# Patient Record
Sex: Female | Born: 1960 | Race: Black or African American | Hispanic: No | Marital: Single
Health system: Southern US, Community
[De-identification: ages and names within clinical notes are randomized; demographics above are authoritative.]

## PROBLEM LIST (undated history)

## (undated) DIAGNOSIS — I10 Essential (primary) hypertension: Secondary | ICD-10-CM

## (undated) DIAGNOSIS — J45909 Unspecified asthma, uncomplicated: Secondary | ICD-10-CM

## (undated) HISTORY — PX: KNEE SURGERY: SHX244

---

## 2014-05-13 ENCOUNTER — Emergency Department (HOSPITAL_COMMUNITY): Payer: Managed Care, Other (non HMO)

## 2014-05-13 ENCOUNTER — Encounter (HOSPITAL_COMMUNITY): Payer: Self-pay

## 2014-05-13 ENCOUNTER — Emergency Department (HOSPITAL_COMMUNITY)
Admission: EM | Admit: 2014-05-13 | Discharge: 2014-05-13 | Disposition: A | Discharge status: Home / Self Care | Payer: Managed Care, Other (non HMO) | Attending: Emergency Medicine | Admitting: Emergency Medicine

## 2014-05-13 DIAGNOSIS — R5383 Other fatigue: Secondary | ICD-10-CM | POA: Insufficient documentation

## 2014-05-13 DIAGNOSIS — R0981 Nasal congestion: Secondary | ICD-10-CM | POA: Diagnosis not present

## 2014-05-13 DIAGNOSIS — J45909 Unspecified asthma, uncomplicated: Secondary | ICD-10-CM | POA: Diagnosis not present

## 2014-05-13 DIAGNOSIS — R05 Cough: Secondary | ICD-10-CM | POA: Insufficient documentation

## 2014-05-13 DIAGNOSIS — Z79899 Other long term (current) drug therapy: Secondary | ICD-10-CM | POA: Insufficient documentation

## 2014-05-13 DIAGNOSIS — R51 Headache: Secondary | ICD-10-CM | POA: Insufficient documentation

## 2014-05-13 DIAGNOSIS — R509 Fever, unspecified: Secondary | ICD-10-CM | POA: Insufficient documentation

## 2014-05-13 DIAGNOSIS — R69 Illness, unspecified: Secondary | ICD-10-CM

## 2014-05-13 DIAGNOSIS — R110 Nausea: Secondary | ICD-10-CM | POA: Insufficient documentation

## 2014-05-13 DIAGNOSIS — J3489 Other specified disorders of nose and nasal sinuses: Secondary | ICD-10-CM | POA: Insufficient documentation

## 2014-05-13 DIAGNOSIS — M791 Myalgia: Secondary | ICD-10-CM | POA: Insufficient documentation

## 2014-05-13 DIAGNOSIS — R630 Anorexia: Secondary | ICD-10-CM | POA: Diagnosis not present

## 2014-05-13 DIAGNOSIS — J111 Influenza due to unidentified influenza virus with other respiratory manifestations: Secondary | ICD-10-CM

## 2014-05-13 HISTORY — DX: Unspecified asthma, uncomplicated: J45.909

## 2014-05-13 MED ORDER — KETOROLAC TROMETHAMINE 30 MG/ML IJ SOLN
30.0000 mg | Freq: Once | INTRAMUSCULAR | Status: AC
  Administered 2014-05-13: 30 mg via INTRAVENOUS
  Filled 2014-05-13: qty 1

## 2014-05-13 MED ORDER — BENZONATATE 100 MG PO CAPS: 100.0000 mg | ORAL_CAPSULE | Freq: Three times a day (TID) | ORAL | Status: DC

## 2014-05-13 MED ORDER — SODIUM CHLORIDE 0.9 % IV BOLUS (SEPSIS)
1000.0000 mL | Freq: Once | INTRAVENOUS | Status: AC
  Administered 2014-05-13: 1000 mL via INTRAVENOUS

## 2014-05-13 MED ORDER — ONDANSETRON 4 MG PO TBDP: 4.0000 mg | ORAL_TABLET | Freq: Three times a day (TID) | ORAL | Status: DC | PRN

## 2014-05-13 MED ORDER — ONDANSETRON HCL 4 MG/2ML IJ SOLN
4.0000 mg | Freq: Once | INTRAMUSCULAR | Status: AC
  Administered 2014-05-13: 4 mg via INTRAVENOUS
  Filled 2014-05-13: qty 2

## 2014-05-13 MED ORDER — METOCLOPRAMIDE HCL 5 MG/ML IJ SOLN
10.0000 mg | Freq: Once | INTRAMUSCULAR | Status: AC
  Administered 2014-05-13: 10 mg via INTRAVENOUS
  Filled 2014-05-13: qty 2

## 2014-05-13 MED ORDER — NAPROXEN 500 MG PO TABS: 500.0000 mg | ORAL_TABLET | Freq: Two times a day (BID) | ORAL | Status: DC

## 2014-05-13 MED ORDER — ACETAMINOPHEN 325 MG PO TABS
650.0000 mg | ORAL_TABLET | Freq: Once | ORAL | Status: AC
  Administered 2014-05-13: 650 mg via ORAL
  Filled 2014-05-13: qty 2

## 2014-05-13 NOTE — ED Notes (Addendum)
Pt presents with c/o headache, nausea, and cough that started 3 days ago. Pt reports her body is aching as well. Pt also reports that she has had a fever at home, febrile in triage as well.

## 2014-05-13 NOTE — ED Provider Notes (Signed)
CSN: [id number]     Arrival date & time 05/13/14  1641 History   First MD Initiated Contact with Patient 05/13/14 1658     Chief Complaint  Patient presents with  . Fever  . Headache  . Nausea     (Consider location/radiation/quality/duration/timing/severity/associated sxs/prior Treatment) HPI Comments: Patient with three-day history of fever, body aches, nonproductive cough, headache, nausea without vomiting, nasal congestion. No significant shortness of breath or chest pain. No sore throat. Patient had diarrhea at onset however this is improved. No urinary symptoms, no skin rash. Patient did not receive a flu shot this year. She's been trying over-the-counter medications without relief. No difficulty walking, vision change, weakness in arms or legs. The onset of this condition was acute. The course is constant. Aggravating factors: none. Alleviating factors: none.    Patient is a 54 y.o. female presenting with fever and headaches. The history is provided by the patient.  Fever Associated symptoms: chills, congestion, cough, headaches, myalgias, nausea and rhinorrhea   Associated symptoms: no chest pain, no diarrhea, no dysuria, no ear pain, no rash, no sore throat and no vomiting   Headache Associated symptoms: congestion, cough, fatigue, fever, myalgias and nausea   Associated symptoms: no abdominal pain, no diarrhea, no dizziness, no ear pain, no numbness, no sore throat, no vomiting and no weakness     Past Medical History  Diagnosis Date  . Asthma    Past Surgical History  Procedure Laterality Date  . Knee surgery     No family history on file. History  Substance Use Topics  . Smoking status: Never Smoker   . Smokeless tobacco: Not on file  . Alcohol Use: No   OB History    No data available     Review of Systems  Constitutional: Positive for fever, chills, appetite change and fatigue.  HENT: Positive for congestion and rhinorrhea. Negative for ear pain and sore  throat.   Eyes: Negative for redness.  Respiratory: Positive for cough. Negative for shortness of breath and wheezing.   Cardiovascular: Negative for chest pain.  Gastrointestinal: Positive for nausea. Negative for vomiting, abdominal pain, diarrhea and blood in stool.  Genitourinary: Negative for dysuria.  Musculoskeletal: Positive for myalgias. Negative for gait problem.  Skin: Negative for rash.  Neurological: Positive for headaches. Negative for dizziness, facial asymmetry, speech difficulty, weakness and numbness.    Allergies  Other  Home Medications   Prior to Admission medications   Medication Sig Start Date End Date Taking? Authorizing Provider  ferrous sulfate 325 (65 FE) MG tablet Take 325 mg by mouth daily.   Yes Historical Provider, MD  FIBER PO Take 4 capsules by mouth 2 (two) times daily.   Yes Historical Provider, MD  guaiFENesin (MUCINEX) 600 MG 12 hr tablet Take 600 mg by mouth 2 (two) times daily as needed for cough or to loosen phlegm.   Yes Historical Provider, MD  OVER THE COUNTER MEDICATION Take 1 tablet by mouth once. OTC allergy medication food lion brand.   Yes Historical Provider, MD   BP 129/96 mmHg  Pulse 81  Temp(Src) 101.7 F (38.7 C) (Oral)  Resp 18  SpO2 96%   Physical Exam  Constitutional: She appears well-developed and well-nourished.  Pt tearful.  HENT:  Head: Normocephalic and atraumatic.  Right Ear: Tympanic membrane, external ear and ear canal normal.  Left Ear: Tympanic membrane, external ear and ear canal normal.  Nose: Mucosal edema and rhinorrhea present.  Mouth/Throat: Uvula is midline, oropharynx  is clear and moist and mucous membranes are normal. Mucous membranes are not dry. No oral lesions. No trismus in the jaw. No uvula swelling. No oropharyngeal exudate, posterior oropharyngeal edema, posterior oropharyngeal erythema or tonsillar abscesses.  Eyes: Conjunctivae are normal. Right eye exhibits no discharge. Left eye exhibits no  discharge.  Neck: Normal range of motion. Neck supple.  Full ROM neck, no meningismus  Cardiovascular: Normal rate, regular rhythm and normal heart sounds.   No murmur heard. Pulmonary/Chest: Effort normal and breath sounds normal. No respiratory distress. She has no wheezes. She has no rales.  Abdominal: Soft. There is no tenderness. There is no rebound and no guarding.  Lymphadenopathy:    She has no cervical adenopathy.  Neurological: She is alert.  Skin: Skin is warm and dry.  Psychiatric: She has a normal mood and affect.  Nursing note and vitals reviewed.   ED Course  Procedures (including critical care time) Labs Review Labs Reviewed - No data to display  Imaging Review Dg Chest 2 View  05/13/2014   CLINICAL DATA:  Shortness of breath, cough, congestion, weakness  EXAM: CHEST  2 VIEW  COMPARISON:  None.  FINDINGS: Cardiomediastinal silhouette is unremarkable. No acute infiltrate or pleural effusion. No pulmonary edema. Bony thorax is unremarkable.  IMPRESSION: No active cardiopulmonary disease.   Electronically Signed   By: Natasha MeadLiviu  Pop M.D.   On: 05/13/2014 19:16     EKG Interpretation None       5:56 PM Patient seen and examined. Work-up initiated. Medications ordered. Suspect influenza but will rule out PNA.   Vital signs reviewed and are as follows: BP 129/96 mmHg  Pulse 81  Temp(Src) 101.7 F (38.7 C) (Oral)  Resp 18  SpO2 96%  10:08 PM Patient feeling better after sleeping, reglan, 2L IVF. Will d/c to home with zofran, naproxen, tessalon.   Patient discharged to home. Encouraged to rest and drink plenty of fluids.  Patient told to return to ED or see their primary doctor if their symptoms worsen, high fever not controlled with tylenol, persistent vomiting, they feel they are dehydrated, or if they have any other concerns.  Patient verbalized understanding and agreed with plan.     MDM   Final diagnoses:  Influenza-like illness   Patient with symptoms  consistent with influenza. Vitals are stable, low-grade fever. No signs of dehydration, tolerating PO's. Lungs are clear. CXR neg. Supportive therapy indicated with return if symptoms worsen. Patient counseled.     Renne CriglerJoshua Varvara Legault, PA-C 05/13/14 2209  Raeford RazorStephen Kohut, MD 05/13/14 2312

## 2014-05-13 NOTE — Discharge Instructions (Signed)
Please read and follow all provided instructions.  Your diagnoses today include:  1. Influenza-like illness     Tests performed today include:  Chest x-ray - no pneumonia  Vital signs. See below for your results today.   Medications prescribed:   Zofran (ondansetron) - for nausea and vomiting   Naproxen - anti-inflammatory pain medication  Do not exceed 500mg  naproxen every 12 hours, take with food  You have been prescribed an anti-inflammatory medication or NSAID. Take with food. Take smallest effective dose for the shortest duration needed for your pain. Stop taking if you experience stomach pain or vomiting.    Tessalon Perles - cough suppressant medication  Take any prescribed medications only as directed.  Home care instructions:  Follow any educational materials contained in this packet. Please continue drinking plenty of fluids. Use over-the-counter cold and flu medications as needed as directed on packaging for symptom relief. You may also use ibuprofen or tylenol as directed on packaging for pain or fever.   BE VERY CAREFUL not to take multiple medicines containing Tylenol (also called acetaminophen). Doing so can lead to an overdose which can damage your liver and cause liver failure and possibly death.   Follow-up instructions: Please follow-up with your primary care provider in the next 3 days for further evaluation of your symptoms.   Return instructions:   Please return to the Emergency Department if you experience worsening symptoms.  Please return if you have a high fever greater than 101 degrees not controlled with over-the-counter medications, persistent vomiting and cannot keep down fluids, or worsening trouble breathing.  Please return if you have any other emergent concerns.  Additional Information:  Your vital signs today were: BP 116/62 mmHg   Pulse 95   Temp(Src) 99.8 F (37.7 C) (Oral)   Resp 20   SpO2 98% If your blood pressure (BP) was  elevated above 135/85 this visit, please have this repeated by your doctor within one month.

## 2017-02-08 ENCOUNTER — Emergency Department (HOSPITAL_COMMUNITY)
Admission: EM | Admit: 2017-02-08 | Discharge: 2017-02-08 | Disposition: A | Discharge status: Home / Self Care | Payer: Managed Care, Other (non HMO) | Attending: Emergency Medicine | Admitting: Emergency Medicine

## 2017-02-08 ENCOUNTER — Other Ambulatory Visit: Payer: Self-pay

## 2017-02-08 ENCOUNTER — Emergency Department (HOSPITAL_COMMUNITY): Payer: Managed Care, Other (non HMO)

## 2017-02-08 ENCOUNTER — Encounter (HOSPITAL_COMMUNITY): Payer: Self-pay | Admitting: Emergency Medicine

## 2017-02-08 DIAGNOSIS — I10 Essential (primary) hypertension: Secondary | ICD-10-CM | POA: Insufficient documentation

## 2017-02-08 DIAGNOSIS — J40 Bronchitis, not specified as acute or chronic: Secondary | ICD-10-CM | POA: Insufficient documentation

## 2017-02-08 DIAGNOSIS — J4521 Mild intermittent asthma with (acute) exacerbation: Secondary | ICD-10-CM

## 2017-02-08 DIAGNOSIS — Z79899 Other long term (current) drug therapy: Secondary | ICD-10-CM | POA: Diagnosis not present

## 2017-02-08 DIAGNOSIS — R0602 Shortness of breath: Secondary | ICD-10-CM | POA: Diagnosis present

## 2017-02-08 HISTORY — DX: Essential (primary) hypertension: I10

## 2017-02-08 LAB — RAPID STREP SCREEN (MED CTR MEBANE ONLY): Streptococcus, Group A Screen (Direct): NEGATIVE

## 2017-02-08 MED ORDER — BENZONATATE 100 MG PO CAPS: 100.0000 mg | ORAL_CAPSULE | Freq: Three times a day (TID) | ORAL | 0 refills | Status: DC

## 2017-02-08 MED ORDER — PREDNISONE 10 MG PO TABS: ORAL_TABLET | ORAL | 0 refills | Status: AC

## 2017-02-08 MED ORDER — PREDNISONE 20 MG PO TABS
60.0000 mg | ORAL_TABLET | Freq: Once | ORAL | Status: AC
  Administered 2017-02-08: 60 mg via ORAL
  Filled 2017-02-08: qty 3

## 2017-02-08 MED ORDER — ALBUTEROL SULFATE HFA 108 (90 BASE) MCG/ACT IN AERS
1.0000 | INHALATION_SPRAY | Freq: Once | RESPIRATORY_TRACT | Status: AC
  Administered 2017-02-08: 1 via RESPIRATORY_TRACT
  Filled 2017-02-08: qty 6.7

## 2017-02-08 MED ORDER — ALBUTEROL SULFATE (2.5 MG/3ML) 0.083% IN NEBU
5.0000 mg | INHALATION_SOLUTION | Freq: Once | RESPIRATORY_TRACT | Status: AC
  Administered 2017-02-08: 5 mg via RESPIRATORY_TRACT
  Filled 2017-02-08: qty 6

## 2017-02-08 MED ORDER — ALBUTEROL SULFATE HFA 108 (90 BASE) MCG/ACT IN AERS: 1.0000 | INHALATION_SPRAY | Freq: Four times a day (QID) | RESPIRATORY_TRACT | 0 refills | Status: AC | PRN

## 2017-02-08 MED ORDER — IPRATROPIUM BROMIDE 0.02 % IN SOLN
0.5000 mg | Freq: Once | RESPIRATORY_TRACT | Status: AC
  Administered 2017-02-08: 0.5 mg via RESPIRATORY_TRACT
  Filled 2017-02-08: qty 2.5

## 2017-02-08 MED ORDER — AZITHROMYCIN 250 MG PO TABS: 250.0000 mg | ORAL_TABLET | Freq: Every day | ORAL | 0 refills | Status: DC

## 2017-02-08 NOTE — ED Provider Notes (Signed)
[facility] Provider Note   CSN: [id number] Arrival date & time: 02/08/17  1056     History   Chief Complaint Chief Complaint  Patient presents with  . Shortness of Breath    HPI Pam Hunter is a 56 y.o. female.  HPI   Patient is a 56 year old female with a history of asthma and hypertension presenting for cough, congestion, and shortness of breath for 3 days.  Patient reports that she is [place] her [place] here [place] from [place] and did not bring any of her inhalers.  During the travel down here, patient began becoming congested and having rhinorrhea as well as cough.  Patient reports that her cough is dry and is keeping her up at night.  No recorded fevers or chills at home.  Patient has been trying over-the-counter cough drops for her symptoms.  No recent hospitalizations, surgeries, history of DVT/PE, no history of cancer, or estrogen use.  Past Medical History:  Diagnosis Date  . Asthma   . Hypertension     There are no active problems to display for this patient.   Past Surgical History:  Procedure Laterality Date  . KNEE SURGERY      OB History    No data available       Home Medications    Prior to Admission medications   Medication Sig Start Date End Date Taking? Authorizing Provider  albuterol (PROVENTIL HFA;VENTOLIN HFA) 108 (90 Base) MCG/ACT inhaler Inhale 1-2 puffs into the lungs every 6 (six) hours as needed for wheezing or shortness of breath. 02/08/17   Aviva KluverMurray, Senora Lacson B, PA-C  azithromycin (ZITHROMAX) 250 MG tablet Take 1 tablet (250 mg total) by mouth daily. Take first 2 tablets together, then 1 every day until finished. 02/08/17   Aviva KluverMurray, Leshon Armistead B, PA-C  benzonatate (TESSALON) 100 MG capsule Take 1 capsule (100 mg total) by mouth every 8 (eight) hours. 02/08/17   Aviva KluverMurray, Betheny Suchecki B, PA-C  ferrous sulfate 325 (65 FE) MG tablet Take 325 mg by mouth daily.    [provider]  FIBER PO Take 4  capsules by mouth 2 (two) times daily.    [provider]  guaiFENesin (MUCINEX) 600 MG 12 hr tablet Take 600 mg by mouth 2 (two) times daily as needed for cough or to loosen phlegm.    [provider]  naproxen (NAPROSYN) 500 MG tablet Take 1 tablet (500 mg total) by mouth 2 (two) times daily. 05/13/14   Renne CriglerGeiple, Joshua, PA-C  ondansetron (ZOFRAN ODT) 4 MG disintegrating tablet Take 1 tablet (4 mg total) by mouth every 8 (eight) hours as needed for nausea or vomiting. 05/13/14   Renne CriglerGeiple, Joshua, PA-C  OVER THE COUNTER MEDICATION Take 1 tablet by mouth once. OTC allergy medication food lion brand.    [provider]  predniSONE (DELTASONE) 10 MG tablet Take 5 tablets (50 mg total) by mouth daily with breakfast for 1 day, THEN 4 tablets (40 mg total) daily with breakfast for 1 day, THEN 3 tablets (30 mg total) daily with breakfast for 1 day, THEN 2 tablets (20 mg total) daily with breakfast for 1 day, THEN 1 tablet (10 mg total) daily with breakfast for 1 day. 02/08/17 02/13/17  Elisha PonderMurray, Rafia Shedden B, PA-C    Family History No family history on file.  Social History Social History   Tobacco Use  . Smoking status: Never Smoker  . Smokeless tobacco: Never Used  Substance Use Topics  . Alcohol  use: No  . Drug use: No     Allergies   Other and Sulfa antibiotics   Review of Systems Review of Systems  Constitutional: Negative for chills and fever.  HENT: Positive for congestion, rhinorrhea and sore throat. Negative for sinus pressure, trouble swallowing and voice change.   Respiratory: Positive for cough and shortness of breath.   Cardiovascular: Negative for chest pain.  Gastrointestinal: Negative for abdominal pain, nausea and vomiting.  Musculoskeletal: Negative for myalgias.  Neurological: Positive for headaches.  All other systems reviewed and are negative.    Physical Exam Updated Vital Signs BP 134/86 (BP Location: Right Arm)   Pulse 99   Temp 100 F  (37.8 C) (Oral)   Resp 16   SpO2 100%   Physical Exam  Constitutional: She appears well-developed and well-nourished. No distress.  HENT:  Head: Normocephalic and atraumatic.  Mouth/Throat: Oropharynx is clear and moist.  Normal phonation. No muffled voice sounds. Patient swallows secretions without difficulty. Dentition normal. No lesions of tongue or buccal mucosa. Uvula midline. No asymmetric swelling of the posterior pharynx. Minimal erythema of posterior pharynx. No tonsillar exuduate. No lingual swelling. No induration inferior to tongue. No submandibular tenderness, swelling, or induration.  Tissues of the neck supple. No cervical lymphadenopathy.  Eyes: Conjunctivae and EOM are normal. Pupils are equal, round, and reactive to light.  Neck: Normal range of motion. Neck supple.  Cardiovascular: Normal rate, regular rhythm, S1 normal and S2 normal.  No murmur heard. No lower extremity edema.  Intact distal pulses.  No calf tenderness.  Pulmonary/Chest: Effort normal and breath sounds normal. She has no wheezes.  Coarse lung sounds in bilateral posterior lower lung fields.  Abdominal: Soft. She exhibits no distension. There is no tenderness. There is no guarding.  Musculoskeletal: Normal range of motion. She exhibits no edema or deformity.  Lymphadenopathy:    She has no cervical adenopathy.  Neurological: She is alert.  Cranial nerves grossly intact. Patient was extremities symmetrically and with good coordination.  Skin: Skin is warm and dry. No rash noted. No erythema.  Psychiatric: She has a normal mood and affect. Her behavior is normal. Judgment and thought content normal.  Nursing note and vitals reviewed.    ED Treatments / Results  Labs (all labs ordered are listed, but only abnormal results are displayed) Labs Reviewed  RAPID STREP SCREEN (NOT AT [facility])  CULTURE, GROUP A STREP [facility])    EKG  EKG Interpretation None       Radiology Dg Chest 2 View  Result  Date: 02/08/2017 CLINICAL DATA:  Cough, wheezing EXAM: CHEST  2 VIEW COMPARISON:  05/13/2014 FINDINGS: The heart size and mediastinal contours are within normal limits. Both lungs are clear. The visualized skeletal structures are unremarkable. IMPRESSION: No active cardiopulmonary disease. Electronically Signed   By: Elige KoHetal  Patel   On: 02/08/2017 12:06    Procedures Procedures (including critical care time)  Medications Ordered in ED Medications  predniSONE (DELTASONE) tablet 60 mg (60 mg Oral Given 02/08/17 1216)  albuterol (PROVENTIL) (2.5 MG/3ML) 0.083% nebulizer solution 5 mg (5 mg Nebulization Given 02/08/17 1216)  ipratropium (ATROVENT) nebulizer solution 0.5 mg (0.5 mg Nebulization Given 02/08/17 1216)  albuterol (PROVENTIL HFA;VENTOLIN HFA) 108 (90 Base) MCG/ACT inhaler 1 puff (1 puff Inhalation Given 02/08/17 1447)     Initial Impression / Assessment and Plan / ED Course  I have reviewed the triage vital signs and the nursing notes.  Pertinent labs & imaging results that were available  during my care of the patient were reviewed by me and considered in my medical decision making (see chart for details).     Final Clinical Impressions(s) / ED Diagnoses   Final diagnoses:  Bronchitis  Mild intermittent asthma with exacerbation   Patient is nontoxic-appearing, afebrile and in no acute distress at this time.  No antipyretics prior to arrival. I suspect that this is a possible asthma exacerbation secondary to upper respiratory infection.  Will obtain chest x-ray and administer breathing treatment as well as prednisone.  On reevaluation, patient feeling improved.  Patient ambulating in the emergency department without shortness of breath and with ease.  Chest x-ray without evidence of pneumonia.  Rapid strep negative.  Patient with likely asthma exacerbation compounded with upper respiratory infection.  Will treat with albuterol, prednisone, Tessalon, and a azithromycin.  Return  precautions given for any worsening shortness of breath, chest pain, or intractable nausea or vomiting.  Patient is in understanding and agrees with the plan of care.  ED Discharge Orders        Ordered    benzonatate (TESSALON) 100 MG capsule  Every 8 hours     02/08/17 1437    albuterol (PROVENTIL HFA;VENTOLIN HFA) 108 (90 Base) MCG/ACT inhaler  Every 6 hours PRN     02/08/17 1437    azithromycin (ZITHROMAX) 250 MG tablet  Daily     02/08/17 1437    predniSONE (DELTASONE) 10 MG tablet     02/08/17 1437       Delia Chimes 02/08/17 1534    Tilden Fossa, MD 02/10/17 [id number]

## 2017-02-08 NOTE — ED Triage Notes (Signed)
States is from  Out of town and left her asthma meds at home and she is wheezing and needs them

## 2017-02-08 NOTE — ED Notes (Signed)
Gave pt apple juice  

## 2017-02-08 NOTE — ED Notes (Signed)
To x-ray

## 2017-02-08 NOTE — ED Notes (Signed)
States is better

## 2017-02-08 NOTE — Discharge Instructions (Signed)
Please read and follow all provided instructions.  Your diagnoses today include:  1. Bronchitis     You appear to have an upper respiratory infection (URI). An upper respiratory tract infection, or cold, is a viral infection of the air passages leading to the lungs. It should improve gradually after 5-7 days. You may have a lingering cough that lasts for 2- 4 weeks after the infection. We often cover with antibiotics in high risk patients.   Tests performed today include: Vital signs. See below for your results today.   Medications prescribed:   Take any prescribed medications only as directed. Treatment for your infection is aimed at treating the symptoms. There are no medications, such as antibiotics, that will cure your infection.   Home care instructions:  Follow any educational materials contained in this packet.   Your illness is contagious and can be spread to others, especially during the first 3 or 4 days. It cannot be cured by antibiotics or other medicines. Take basic precautions such as washing your hands often, covering your mouth when you cough or sneeze, and avoiding public places where you could spread your illness to others.   Please continue drinking plenty of fluids.  Use over-the-counter medicines as needed as directed on packaging for symptom relief.  You may also use ibuprofen or tylenol as directed on packaging for pain or fever.  Do not take multiple medicines containing Tylenol or acetaminophen to avoid taking too much of this medication.  Follow-up instructions: Please follow-up with your primary care provider in the next 3 days for further evaluation of your symptoms if you are not feeling better.   Return instructions:  Please return to the Emergency Department if you experience worsening symptoms.  RETURN IMMEDIATELY IF you develop shortness of breath, confusion or altered mental status, a new rash, become dizzy, faint, or poorly responsive, or are unable to be  cared for at home. Please return if you have persistent vomiting and cannot keep down fluids or develop a fever that is not controlled by tylenol or motrin.   Please return if you have any other emergent concerns.  Additional Information:  Your vital signs today were: BP 122/86 (BP Location: Right Arm)    Pulse (!) 102    Temp 100 F (37.8 C) (Oral)    Resp 19    SpO2 99%  If your blood pressure (BP) was elevated above 135/85 this visit, please have this repeated by your doctor within one month. --------------

## 2017-02-08 NOTE — ED Notes (Signed)
Patient transported to X-ray 

## 2017-02-10 LAB — CULTURE, GROUP A STREP (THRC)

## 2017-06-23 ENCOUNTER — Emergency Department
Admission: EM | Admit: 2017-06-23 | Discharge: 2017-06-23 | Disposition: A | Discharge status: Home / Self Care | Payer: Commercial Managed Care - PPO | Attending: Emergency Medicine | Admitting: Emergency Medicine

## 2017-06-23 DIAGNOSIS — D72819 Decreased white blood cell count, unspecified: Secondary | ICD-10-CM | POA: Insufficient documentation

## 2017-06-23 DIAGNOSIS — M7989 Other specified soft tissue disorders: Secondary | ICD-10-CM | POA: Insufficient documentation

## 2017-06-23 LAB — COMPREHENSIVE METABOLIC PANEL
ALT: 10 U/L (ref 0–55)
AST (SGOT): 25 U/L (ref 5–34)
Albumin/Globulin Ratio: 1 (ref 0.9–2.2)
Albumin: 3.9 g/dL (ref 3.5–5.0)
Alkaline Phosphatase: 54 U/L (ref 37–106)
BUN: 9 mg/dL (ref 7.0–19.0)
Bilirubin, Total: 0.2 mg/dL (ref 0.2–1.2)
CO2: 23 mEq/L (ref 22–29)
Calcium: 9.4 mg/dL (ref 8.5–10.5)
Chloride: 105 mEq/L (ref 100–111)
Creatinine: 0.7 mg/dL (ref 0.6–1.0)
Globulin: 3.8 g/dL — ABNORMAL HIGH (ref 2.0–3.6)
Glucose: 77 mg/dL (ref 70–100)
Potassium: 4 mEq/L (ref 3.5–5.1)
Protein, Total: 7.7 g/dL (ref 6.0–8.3)
Sodium: 138 mEq/L (ref 136–145)

## 2017-06-23 LAB — URINALYSIS, REFLEX TO MICROSCOPIC EXAM IF INDICATED
Bilirubin, UA: NEGATIVE
Blood, UA: NEGATIVE
Glucose, UA: NEGATIVE
Ketones UA: 20 — AB
Leukocyte Esterase, UA: NEGATIVE
Nitrite, UA: NEGATIVE
Protein, UR: 100 — AB
Specific Gravity UA: 1.028 (ref 1.001–1.035)
Urine pH: 6 (ref 5.0–8.0)
Urobilinogen, UA: NORMAL mg/dL

## 2017-06-23 LAB — CBC AND DIFFERENTIAL
Absolute NRBC: 0 10*3/uL (ref 0.00–0.00)
Hematocrit: 41.1 % (ref 34.7–43.7)
Hgb: 13.1 g/dL (ref 11.4–14.8)
MCH: 29.7 pg (ref 25.1–33.5)
MCHC: 31.9 g/dL (ref 31.5–35.8)
MCV: 93.2 fL (ref 78.0–96.0)
MPV: 10.3 fL (ref 8.9–12.5)
Nucleated RBC: 0 /100 WBC (ref 0.0–0.0)
Platelets: 245 10*3/uL (ref 142–346)
RBC: 4.41 10*6/uL (ref 3.90–5.10)
RDW: 13 % (ref 11–15)
WBC: 2.42 10*3/uL — ABNORMAL LOW (ref 3.10–9.50)

## 2017-06-23 LAB — SEDIMENTATION RATE: Sed Rate: 20 mm/Hr (ref 0–20)

## 2017-06-23 LAB — MAN DIFF ONLY
Atypical Lymphocytes %: 2 %
Atypical Lymphocytes Absolute: 0.05 10*3/uL — ABNORMAL HIGH (ref 0.00–0.00)
Band Neutrophils Absolute: 0.02 10*3/uL (ref 0.00–1.00)
Band Neutrophils: 1 %
Basophils Absolute Manual: 0.07 10*3/uL (ref 0.00–0.08)
Basophils Manual: 3 %
Eosinophils Absolute Manual: 0.1 10*3/uL (ref 0.00–0.44)
Eosinophils Manual: 4 %
Lymphocytes Absolute Manual: 1.89 10*3/uL (ref 0.42–3.22)
Lymphocytes Manual: 78 %
Monocytes Absolute: 0.12 10*3/uL — ABNORMAL LOW (ref 0.21–0.85)
Monocytes Manual: 5 %
Neutrophils Absolute Manual: 0.17 10*3/uL — ABNORMAL LOW (ref 1.10–6.33)
Segmented Neutrophils: 7 %

## 2017-06-23 LAB — TSH: TSH: 3.47 u[IU]/mL (ref 0.35–4.94)

## 2017-06-23 LAB — GFR: EGFR: >60

## 2017-06-23 LAB — CELL MORPHOLOGY
Cell Morphology: NORMAL
Platelet Estimate: NORMAL

## 2017-06-23 LAB — MAGNESIUM: Magnesium: 2.7 mg/dL — ABNORMAL HIGH (ref 1.6–2.6)

## 2017-06-23 LAB — C-REACTIVE PROTEIN: C-Reactive Protein: 0.7 mg/dL (ref 0.0–0.8)

## 2017-06-23 LAB — PHOSPHORUS: Phosphorus: 3.8 mg/dL (ref 2.3–4.7)

## 2017-06-23 NOTE — Discharge Instructions (Signed)
Dear [name]:    Thank you for choosing the [facility] Emergency Department, the premier emergency department in the  area.  I hope your visit today was EXCELLENT.    Specific instructions for your visit today:    You were seen in the emergency department for discomfort and swelling in your hands as well as a rash on your arms and legs. Your screening emergency department labs were reassuring but did not show a cause of your symptoms. We discussed your results and recommended that you follow up with rheumatology for your rash and hand swelling and hematology for your low white blood cell count.     Keep all appointments with your doctor. Following up with your doctor or the referral doctor is very important.    YOU SHOULD SEEK MEDICAL ATTENTION IMMEDIATELY, EITHER HERE OR AT THE NEAREST EMERGENCY DEPARTMENT, IF ANY OF THE FOLLOWING OCCUR:     You have vomiting (throwing up) that stops you from keeping any fluids in your stomach.   You have a faster heart rate. You feel lightheaded, or you pass out (lose consciousness).   You are vomiting blood or you have serious chest pain after vomiting.   You have a fever (temperature higher than 100.64F or 38C), chills, or abdominal (belly) pain.    If you can't talk with your doctor, or if you feel you need to be rechecked, come back here or go to the nearest emergency department.      If you do not continue to improve or your condition worsens, please contact your doctor or return immediately to the Emergency Department.    Sincerely,  Lang, Alcario Drought, MD  Attending Emergency Physician  [facility] Emergency Department    ONSITE PHARMACY  Our full service onsite pharmacy is located in the ER waiting room.  Open 7 days a week from 9 am to 9 pm.  We accept all major insurances and prices are competitive with major retailers.  Ask your provider to print your prescriptions down to the pharmacy to speed you on your way home.    OBTAINING A  PRIMARY CARE APPOINTMENT    Primary care physicians (PCPs, also known as primary care doctors) are either internists or family medicine doctors. Both types of PCPs focus on health promotion, disease prevention, patient education and counseling, and treatment of acute and chronic medical conditions.    Call for an appointment with a primary care doctor.  Ask to see who is taking new patients.     [facility]  telephone:  [phone]  [email or web address]    DOCTOR REFERRALS  Call [phone] (available 24 hours a day, 7 days a week) if you need any further referrals and we can help you find a primary care doctor or specialist.  Also, available online at:  [email or web address]    YOUR CONTACT INFORMATION  Before leaving please check with registration to make sure we have an up-to-date contact number.  You can call registration at [phone] to update your information.  For questions about your hospital bill, please call [phone].  For questions about your Emergency Dept Physician bill please call [phone].      FREE HEALTH SERVICES  If you need help with health or social services, please call 2-1-1 for a free referral to resources in your area.  2-1-1 is a free service connecting people with information on health insurance, free clinics, pregnancy, mental health, dental care, food assistance, housing, and substance abuse counseling.  Also, available online at:  http://www.[facility].org    MEDICAL RECORDS AND TESTS  Certain laboratory test results do not come back the same day, for example urine cultures.   We will contact you if other important findings are noted.  Radiology films are often reviewed again to ensure accuracy.  If there is any discrepancy, we will notify you.      Please call [phone] to pick up a complimentary CD of any radiology studies performed.  If you or your doctor would like to request a copy of your medical records, please call [phone].       ORTHOPEDIC INJURY   Please know that significant injuries can exist even when an initial x-ray is read as normal or negative.  This can occur because some fractures (broken bones) are not initially visible on x-rays.  For this reason, close outpatient follow-up with your primary care doctor or bone specialist (orthopedist) is required.    MEDICATIONS AND FOLLOWUP  Please be aware that some prescription medications can cause drowsiness.  Use caution when driving or operating machinery.    The examination and treatment you have received in our Emergency Department is provided on an emergency basis, and is not intended to be a substitute for your primary care physician.  It is important that your doctor checks you again and that you report any new or remaining problems at that time.      24 HOUR PHARMACIES  The nearest 24 hour pharmacy is:    [facility]  [address]  [place], [place] [address]  [phone]      ASSISTANCE WITH INSURANCE    Affordable Care Act  [facility])  Call to start or finish an application, compare plans, enroll or ask a question.  [phone]  TTY: [phone]  Web:  Healthcare.gov    Help Enrolling in [facility]  [employer] [place]  980-101-5214 (TOLL-FREE)  727-333-8896 (TTY)  Web:  Http://www.coverva.org    Local Help Enrolling in the [facility]  [place] [facility]  (302)043-7356 (MAIN)  Email:  health-help@nvfs .org  Web:  BlackjackMyths.is  Address:  [address] [place], [place] [address]    SEDATING MEDICATIONS  Sedating medications include strong pain medications (e.g. narcotics), muscle relaxers, benzodiazepines (used for anxiety and as muscle relaxers), Benadryl/diphenhydramine and other antihistamines for allergic reactions/itching, and other medications.  If you are unsure if you have received a sedating medication, please ask your physician or nurse.  If you received a sedating medication: DO NOT drive a car. DO NOT operate machinery. DO  NOT perform jobs where you need to be alert.  DO NOT drink alcoholic beverages while taking this medicine.     If you get dizzy, sit or lie down at the first signs. Be careful going up and down stairs.  Be extra careful to prevent falls.     Never give this medicine to others.     Keep this medicine out of reach of children.     Do not take or save old medicines. Throw them away when outdated.     Keep all medicines in a cool, dry place. DO NOT keep them in your bathroom medicine cabinet or in a cabinet above the stove.    MEDICATION REFILLS  Please be aware that we cannot refill any prescriptions through the ER. If you need further treatment from what is provided at your ER visit, please follow up with your primary care doctor or your pain  management specialist.    [place]  Did you know [facility] has two freestanding ERs located just a few miles away?  [facility] [place] [facility] [place] have short wait times, easy free parking directly in front of the building and top patient satisfaction scores - and the same Board Certified Emergency Medicine doctors as [facility].

## 2017-06-23 NOTE — ED Provider Notes (Signed)
[facility] EMERGENCY DEPARTMENT RESIDENT H&P       CLINICAL INFORMATION        HPI:      Chief Complaint: Generalized weakness  .    Mackenzie Powell is a 57 y.o. 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 Pt states that for the past few months she has noted pain, coldness, hyperpigmentation and swelling in her hands bilaterally. She states she saw a doctor for this and was started on hydroxychloroquine but she doesn't know why. She is tearful and very alarmed about her hands because she feels that not one has been able to tell her what is wrong. She also reports worsening rashes on the insides of her arms and her thighs. The rash is hyperpigmented, erythematous and painful. She has not put anything new on her skin or used any medications for the rash.     Pt states that 1 year ago she was tested for lupus after routine lab work with her pmd showed leukopenia. At that time she saw a hematologist but no cause of the leukopenia was found. She does not know what further testing was done.       PMD is Ardyth Man [phone]    History obtained from: patient             ROS:      Review of Systems   Constitutional: Positive for appetite change and fatigue. Negative for fever.   HENT: Negative for congestion and facial swelling.    Eyes: Negative for discharge and redness.   Respiratory: Negative for shortness of breath and wheezing.    Cardiovascular: Positive for chest pain and leg swelling.   Gastrointestinal: Negative for abdominal pain and nausea.   Genitourinary: Negative for difficulty urinating, dysuria and vaginal pain.   Musculoskeletal: Positive for joint swelling and myalgias.   Skin: Positive for rash. Negative for wound.   Allergic/Immunologic: Negative for environmental allergies and food allergies.   Neurological: Negative for dizziness and facial asymmetry.    Psychiatric/Behavioral: Negative for agitation. The patient is nervous/anxious.          Physical Exam:      Pulse 100  BP 124/89  Resp 20  SpO2 100 %  Temp 98.3 F (36.8 C)    Physical Exam   Constitutional: She is oriented to person, place, and time. She appears well-developed and well-nourished.   Tearful, anxious     HENT:   Head: Normocephalic and atraumatic.   Mouth/Throat: Oropharynx is clear and moist.   Eyes: Conjunctivae and EOM are normal.   Neck: Normal range of motion. Neck supple. No tracheal deviation present.   Cardiovascular: Normal rate, regular rhythm and normal heart sounds.    Pulmonary/Chest: Effort normal. No respiratory distress. She has no wheezes. She has no rales.   Musculoskeletal: She exhibits edema.   1+ pitting edema in ankles bilaterally   Neurological: She is alert and oriented to person, place, and time.   Skin: Skin is warm and dry. She is not diaphoretic.   Hyperpigmented, pruritic, erythematous, excoriated rash to flexor surface of arms bilaterally, tender to palpation; medial thighs are hyperpigmented and tender to palpation, no ulcerations or abrasions, nonpluritic   Vitals reviewed.              PAST HISTORY        Primary Care Provider: Ardyth Man,  MD        PMH/PSH:    .     History reviewed. No pertinent past medical history.    She has no past surgical history on file.      Social/Family History:      She has no tobacco, alcohol, and drug history on file.    History reviewed. No pertinent family history.      Listed Medications on Arrival:    .     Home Medications     Med List Status:  In Progress Set By: Jobe Igo, RN at 06/23/2017  7:26 PM        No Medications         Allergies: She is allergic to sulfa antibiotics.            VISIT INFORMATION        Reassessments/Clinical Course:      9:57 PM  Labs  remarkable for leukopenia. Pt has known leukopenia. unk number in past. Previously evaluated by hematology including with bone marrow biopsy that she  says was negative.   ESR wnl. Overall reassuring. Rash on inner thighs seems consistent with acanthosis nigricans given location, hyperpigmentation. Etiology of hand swelling is unclear but does not appear to be due to emergent illness requiring admisison. Discussed results with pt. She is already being followed by rheum and heme near where she lives and is essentially here today for a second opinion as the doctors she has seen are " not telling her what is wrong with her."    Offered her followup with our providers as outpatient if she would like second opinion.     Conversations with Other Providers:              Medications Given in the ED:    .     ED Medication Orders     None            Procedures:      Procedures      Assessment/Plan:      57yoF with hx gastric sleeve who presents with multiple complaints including hand swelling and extremity rash. HDS. 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 Will followup as outpatient for further eval.           Verlee Rossetti, MD  Resident  06/23/17 1610       Verlee Rossetti, MD  Resident  06/23/17 [id number]       Verlee Rossetti, MD  Resident  06/23/17 [id number]       Verlee Rossetti, MD  Resident  06/23/17 [phone]

## 2017-06-23 NOTE — ED Provider Notes (Signed)
[facility] EMERGENCY DEPARTMENT H&P                                             ATTENDING SUPERVISORY NOTE         ATTENDING NOTE      The patient was seen and examined by the Midlevel/Resident. I have reviewed and agree with the history except as noted. I agree with the plan as presented to me. I have reviewed and agree with the final ED diagnosis and disposition.    I spoke to and examined the patient as well.  I was present during key portions of any procedures performed.     HPI: 57 y.o. female with hx of gastric sleeve procedure, leukopenia presents with increased fatigue and body aches for the past 4 months. Also notes bilateral hand swelling, rashes to her forearms. She states symptoms all began after her gastric bypass surgery 4 months prior. Denies any chest pain, vomiting, shortness of breath, fevers, AMS, falls. Reports that she did see a physician during the period and was started on hydroxychloroquine. Does endorse being tested for Lupus after leukopenia on routine blood work. Bone marrow biopsy with hematologist negative.     PEX:   Constitutional: Vital signs reviewed. Well appearing. Cooperative.   Head: Normocephalic, atraumatic   Eyes: No conjunctival injection or pallor. No discharge. PERRL, EOMI   ENT: Mucous membranes moist, OP normal   Neck: Normal range of motion. Non-tender.   Respiratory/Chest: Clear to auscultation. No respiratory distress.   Cardiovascular: Regular rate and rhythm. No murmur, rubs, or gallops.  Abdomen: Soft and non-tender. Non-distended. No rebound or guarding. No rigidity.   Extremities: FROM. No gross deformities. No edema. No cyanosis.   Back: No T/L spine tenderness. No gross deformities.  GU: No CVA tenderness  Neurological: No focal motor deficits by observation. Speech normal. Memory normal.   Skin: Warm and dry. No rash. No pallor.  Psychiatric: Normal affect. Normal concentration. No SI/HI.      Interpretations:  O2 sat-           saturation: 100 %;  Oxygen use: room air; Interpretation: Normal    EKG Interpretation - interpreted by me : Sinus arrhthymia , Rate 82, Normal Intervals, Nonspecific Twave abnormalities, borderline EKG.  Monitor -         interpreted by me: Sinus arrhythmia at 88.            Lendell Caprice, MD    I was acting as a scribe for Lendell Caprice, MD on [facility]  Treatment Team: Scribe: Enriqueta Shutter     I am the first provider for this patient and I personally performed the services documented. Treatment Team: Scribe: Enriqueta Shutter is scribing for me on Williamson,Diella. This note and the patient instructions accurately reflect work and decisions made by me.  Lendell Caprice, MD                  Lendell Caprice, MD  07/03/17 [phone]

## 2017-06-24 LAB — ECG 12-LEAD
Atrial Rate: 82 {beats}/min
P Axis: 69 degrees
P-R Interval: 180 ms
Q-T Interval: 396 ms
QRS Duration: 86 ms
QTC Calculation (Bezet): 462 ms
R Axis: 5 degrees
T Axis: 1 degrees
Ventricular Rate: 82 {beats}/min

## 2017-06-24 LAB — VITAMIN B12: Vitamin B-12: >2000 pg/mL — ABNORMAL HIGH (ref 211–911)

## 2017-06-24 LAB — HEMOLYSIS INDEX: Hemolysis Index: 32 — ABNORMAL HIGH (ref 0–18)

## 2017-06-24 LAB — FOLATE: Folate: 8.8 ng/mL

## 2017-10-02 ENCOUNTER — Ambulatory Visit (INDEPENDENT_AMBULATORY_CARE_PROVIDER_SITE_OTHER): Payer: Commercial Managed Care - PPO | Admitting: Internal Medicine

## 2018-01-29 ENCOUNTER — Emergency Department (HOSPITAL_COMMUNITY)
Admission: EM | Admit: 2018-01-29 | Discharge: 2018-01-29 | Disposition: A | Discharge status: Home / Self Care | Payer: 59 | Attending: Emergency Medicine | Admitting: Emergency Medicine

## 2018-01-29 DIAGNOSIS — J45909 Unspecified asthma, uncomplicated: Secondary | ICD-10-CM | POA: Diagnosis not present

## 2018-01-29 DIAGNOSIS — Z7901 Long term (current) use of anticoagulants: Secondary | ICD-10-CM | POA: Insufficient documentation

## 2018-01-29 DIAGNOSIS — M25512 Pain in left shoulder: Secondary | ICD-10-CM | POA: Diagnosis not present

## 2018-01-29 DIAGNOSIS — Z79899 Other long term (current) drug therapy: Secondary | ICD-10-CM | POA: Diagnosis not present

## 2018-01-29 DIAGNOSIS — M542 Cervicalgia: Secondary | ICD-10-CM | POA: Insufficient documentation

## 2018-01-29 DIAGNOSIS — I10 Essential (primary) hypertension: Secondary | ICD-10-CM | POA: Diagnosis not present

## 2018-01-29 MED ORDER — DEXAMETHASONE 4 MG PO TABS
8.0000 mg | ORAL_TABLET | Freq: Once | ORAL | Status: AC
Start: 2018-01-29 — End: 2018-01-29
  Administered 2018-01-29: 8 mg via ORAL
  Filled 2018-01-29: qty 2

## 2018-01-29 MED ORDER — HYDROCODONE-ACETAMINOPHEN 5-325 MG PO TABS: 2.0000 | ORAL_TABLET | Freq: Four times a day (QID) | ORAL | 0 refills | Status: AC | PRN

## 2018-01-29 MED ORDER — DEXAMETHASONE 4 MG PO TABS: 4.0000 mg | ORAL_TABLET | Freq: Two times a day (BID) | ORAL | 0 refills | Status: AC

## 2018-01-29 MED ORDER — HYDROMORPHONE HCL 1 MG/ML IJ SOLN
1.0000 mg | Freq: Once | INTRAMUSCULAR | Status: AC
  Administered 2018-01-29: 1 mg via INTRAMUSCULAR
  Filled 2018-01-29: qty 1

## 2018-01-29 NOTE — ED Triage Notes (Signed)
Pt. Reports having lt.shoulder pain that radiates into her lt. Arm.  She has increased pain with movement.  She reports that the pain started 2 weeks ago.  +CNS to arm.  She also reports that pain has began in her lt. Lateral neck area.  Pt. Is alert and oriented  X4

## 2018-02-09 NOTE — ED Provider Notes (Signed)
[facility] EMERGENCY DEPARTMENT Provider Note   CSN: [id number] Arrival date & time: 01/29/18  1631     History   Chief Complaint Chief Complaint  Patient presents with  . Shoulder Pain  . Neck Pain    HPI Pam Hunter is a 57 y.o. female.  HPI  57 year old female with left shoulder pain.  Onset about 2 weeks ago.  Initially a mild ache in her left shoulder/neck.  Progressively worsened.  She has a dull ache at rest which is significantly worse with movement.  No acute numbness or tingling.  She denies any acute trauma or strain.  Past Medical History:  Diagnosis Date  . Asthma   . Hypertension     There are no active problems to display for this patient.   Past Surgical History:  Procedure Laterality Date  . KNEE SURGERY       OB History   No obstetric history on file.      Home Medications    Prior to Admission medications   Medication Sig Start Date End Date Taking? Authorizing Provider  albuterol (PROVENTIL HFA;VENTOLIN HFA) 108 (90 Base) MCG/ACT inhaler Inhale 1-2 puffs into the lungs every 6 (six) hours as needed for wheezing or shortness of breath. 02/08/17  Yes [place] ScrapeMurray, Alyssa B, PA-C  apixaban (ELIQUIS) 5 MG TABS tablet Take 5 mg by mouth 2 (two) times daily.   Yes [provider]  dronedarone (MULTAQ) 400 MG tablet Take 400 mg by mouth 2 (two) times daily with a meal.   Yes [provider]  ferrous sulfate 325 (65 FE) MG tablet Take 325 mg by mouth daily.   Yes [provider]  FIBER PO Take 4 capsules by mouth 2 (two) times daily.   Yes [provider]  folic acid (FOLVITE) 1 MG tablet Take 1 mg by mouth daily.   Yes [provider]  gabapentin (NEURONTIN) 300 MG capsule Take 300 mg by mouth 3 (three) times daily.   Yes [provider]  hydrOXYzine (ATARAX/VISTARIL) 25 MG tablet Take 25 mg by mouth 3 (three) times daily as needed for itching.   Yes [provider]    methotrexate (RHEUMATREX) 2.5 MG tablet Take by mouth.   Yes [provider]  Multiple Vitamin (MULTIVITAMIN WITH MINERALS) TABS tablet Take 1 tablet by mouth daily.   Yes [provider]  vitamin E 400 UNIT capsule Take 400 Units by mouth 2 (two) times daily.   Yes [provider]  dexamethasone (DECADRON) 4 MG tablet Take 1 tablet (4 mg total) by mouth 2 (two) times daily. 01/29/18   Raeford RazorKohut, Consuella Scurlock, MD  HYDROcodone-acetaminophen (NORCO/VICODIN) 5-325 MG tablet Take 2 tablets by mouth every 6 (six) hours as needed for severe pain. 01/29/18   Raeford RazorKohut, Lacreshia Bondarenko, MD    Family History No family history on file.  Social History Social History   Tobacco Use  . Smoking status: Never Smoker  . Smokeless tobacco: Never Used  Substance Use Topics  . Alcohol use: No  . Drug use: No      Allergies   Sulfa antibiotics   Review of Systems Review of Systems  All systems reviewed and negative, other than as noted in HPI.  Physical Exam Updated Vital Signs BP 116/82 (BP Location: Right Arm)   Pulse 69   Temp 97.7 F (36.5 C) (Oral)   Resp 16   Ht 5\' 3"  (1.6 m)   Wt 72.6 kg   SpO2  100%   BMI 28.34 kg/m   Physical Exam Vitals signs and nursing note reviewed.  Constitutional:      General: She is not in acute distress.    Appearance: She is well-developed.  HENT:     Head: Normocephalic and atraumatic.  Eyes:     General:        Right eye: No discharge.        Left eye: No discharge.     Conjunctiva/sclera: Conjunctivae normal.  Neck:     Musculoskeletal: Neck supple.  Cardiovascular:     Rate and Rhythm: Normal rate and regular rhythm.     Heart sounds: Normal heart sounds. No murmur. No friction rub. No gallop.   Pulmonary:     Effort: Pulmonary effort is normal. No respiratory distress.     Breath sounds: Normal breath sounds.  Abdominal:     General: There is no distension.     Palpations: Abdomen is soft.     Tenderness: There is no  abdominal tenderness.  Musculoskeletal:        General: Tenderness present.     Comments: TTP along L upper trapezius. Increased pain with ROm of shoulder. No midline spinal tenderness. Neck is supple. NVI.   Skin:    General: Skin is warm and dry.  Neurological:     Mental Status: She is alert.  Psychiatric:        Behavior: Behavior normal.        Thought Content: Thought content normal.      ED Treatments / Results  Labs (all labs ordered are listed, but only abnormal results are displayed) Labs Reviewed - No data to display  EKG None  Radiology No results found.   Procedures Procedures (including critical care time)  Medications Ordered in ED Medications  dexamethasone (DECADRON) tablet 8 mg (8 mg Oral Given 01/29/18 2108)  HYDROmorphone (DILAUDID) injection 1 mg (1 mg Intramuscular Given 01/29/18 2108)     Initial Impression / Assessment and Plan / ED Course  I have reviewed the triage vital signs and the nursing notes.  Pertinent labs & imaging results that were available during my care of the patient were reviewed by me and considered in my medical decision making (see chart for details).     57yF with L neck/shoulder pain. Very consistent with MSK etiology. Denies trauma. Plan symptomatic tx.   It has been determined that no acute conditions requiring further emergency intervention are present at this time. The patient has been advised of the diagnosis and plan.. We have discussed signs and symptoms that warrant return to the ED and they are listed in the discharge instructions.    Final Clinical Impressions(s) / ED Diagnoses   Final diagnoses:  Acute pain of left shoulder    ED Discharge Orders         Ordered    dexamethasone (DECADRON) 4 MG tablet  2 times daily     01/29/18 2053    HYDROcodone-acetaminophen (NORCO/VICODIN) 5-325 MG tablet  Every 6 hours PRN     01/29/18 2053           Raeford RazorKohut, Krina Mraz, MD 02/09/18 1901

## 2019-07-28 IMAGING — CR DG CHEST 2V
2 series · 2 of 2 positions shown · non-contrast
Comparison: 05/13/2014

CLINICAL DATA: Cough, wheezing

EXAM:
CHEST  2 VIEW

[chest lat]
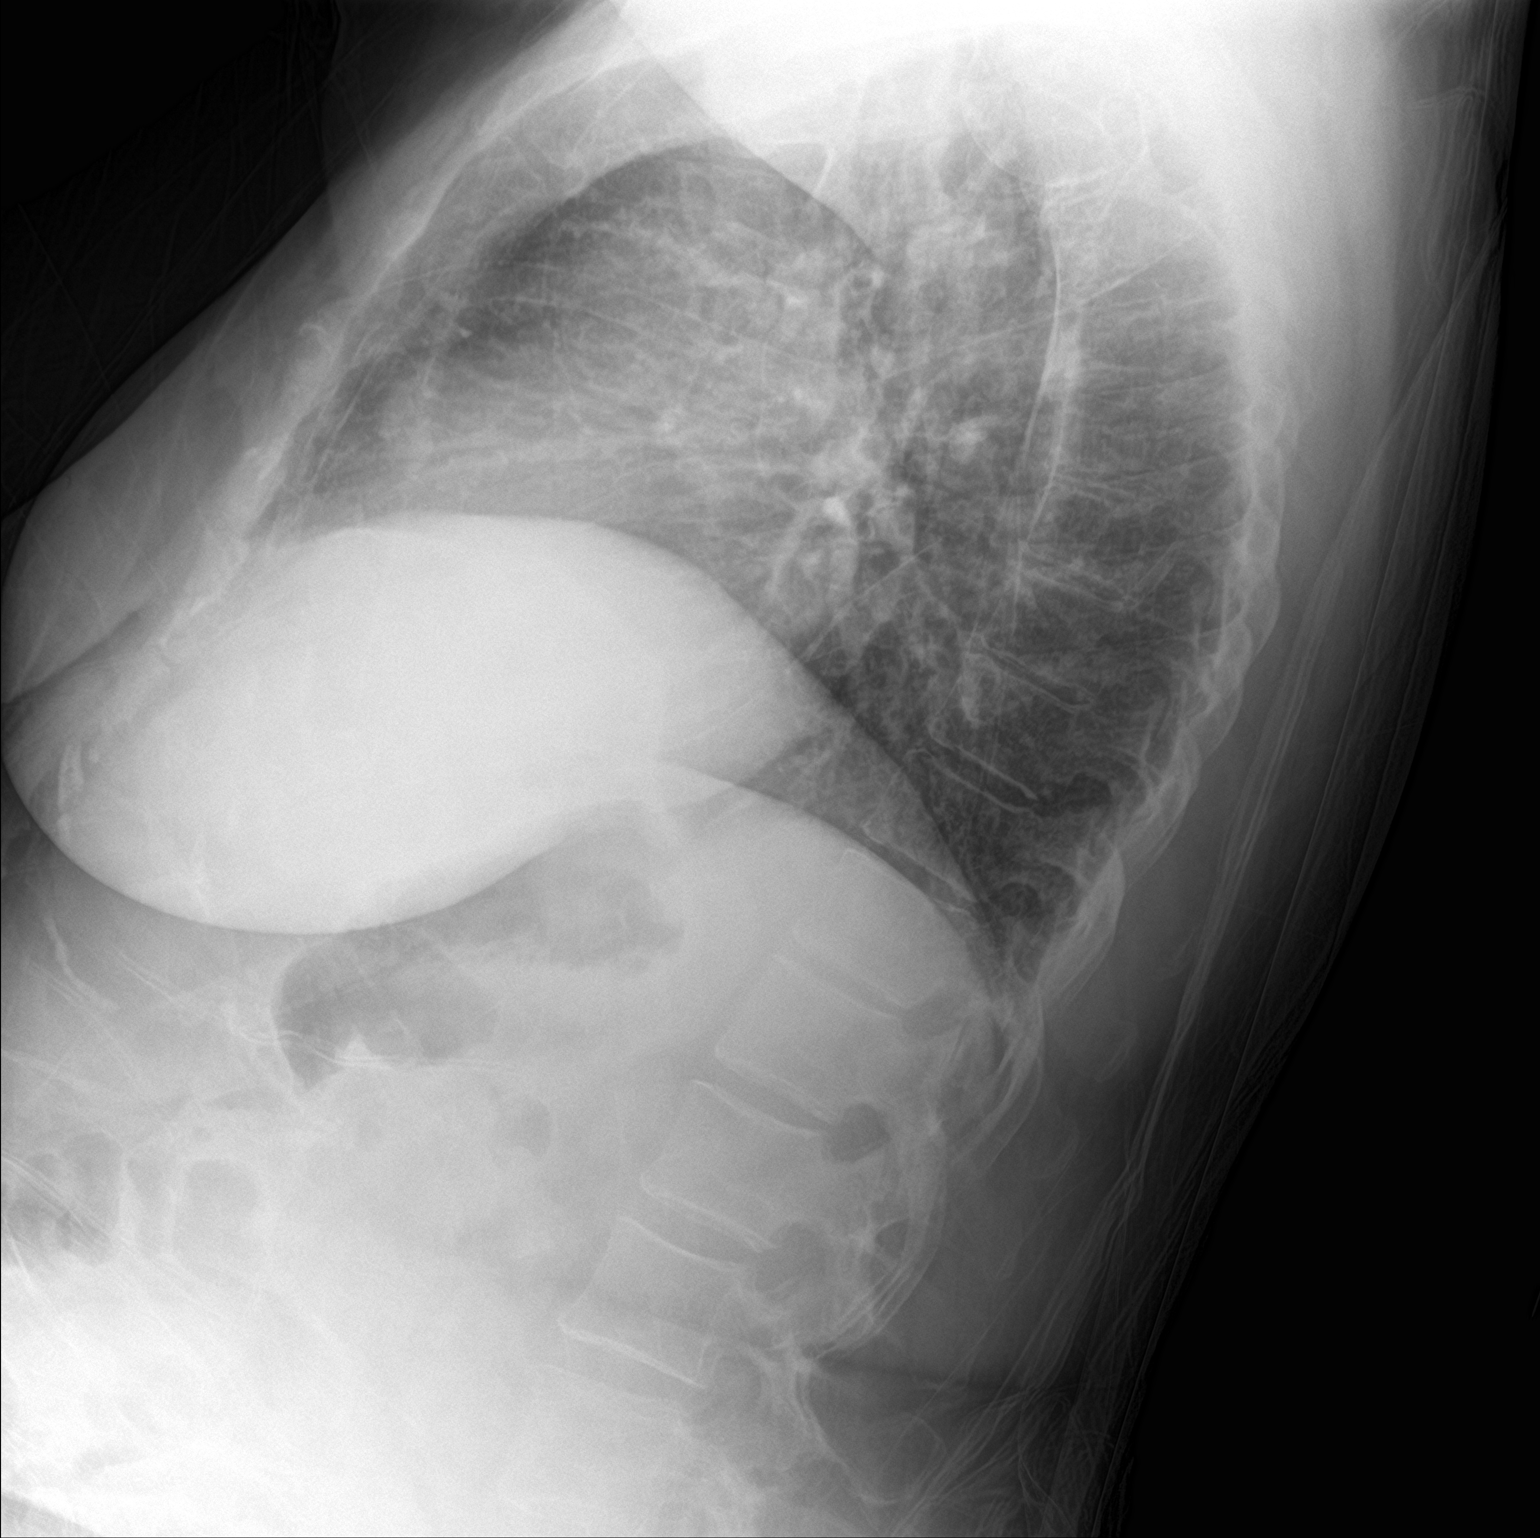

[chest ap]
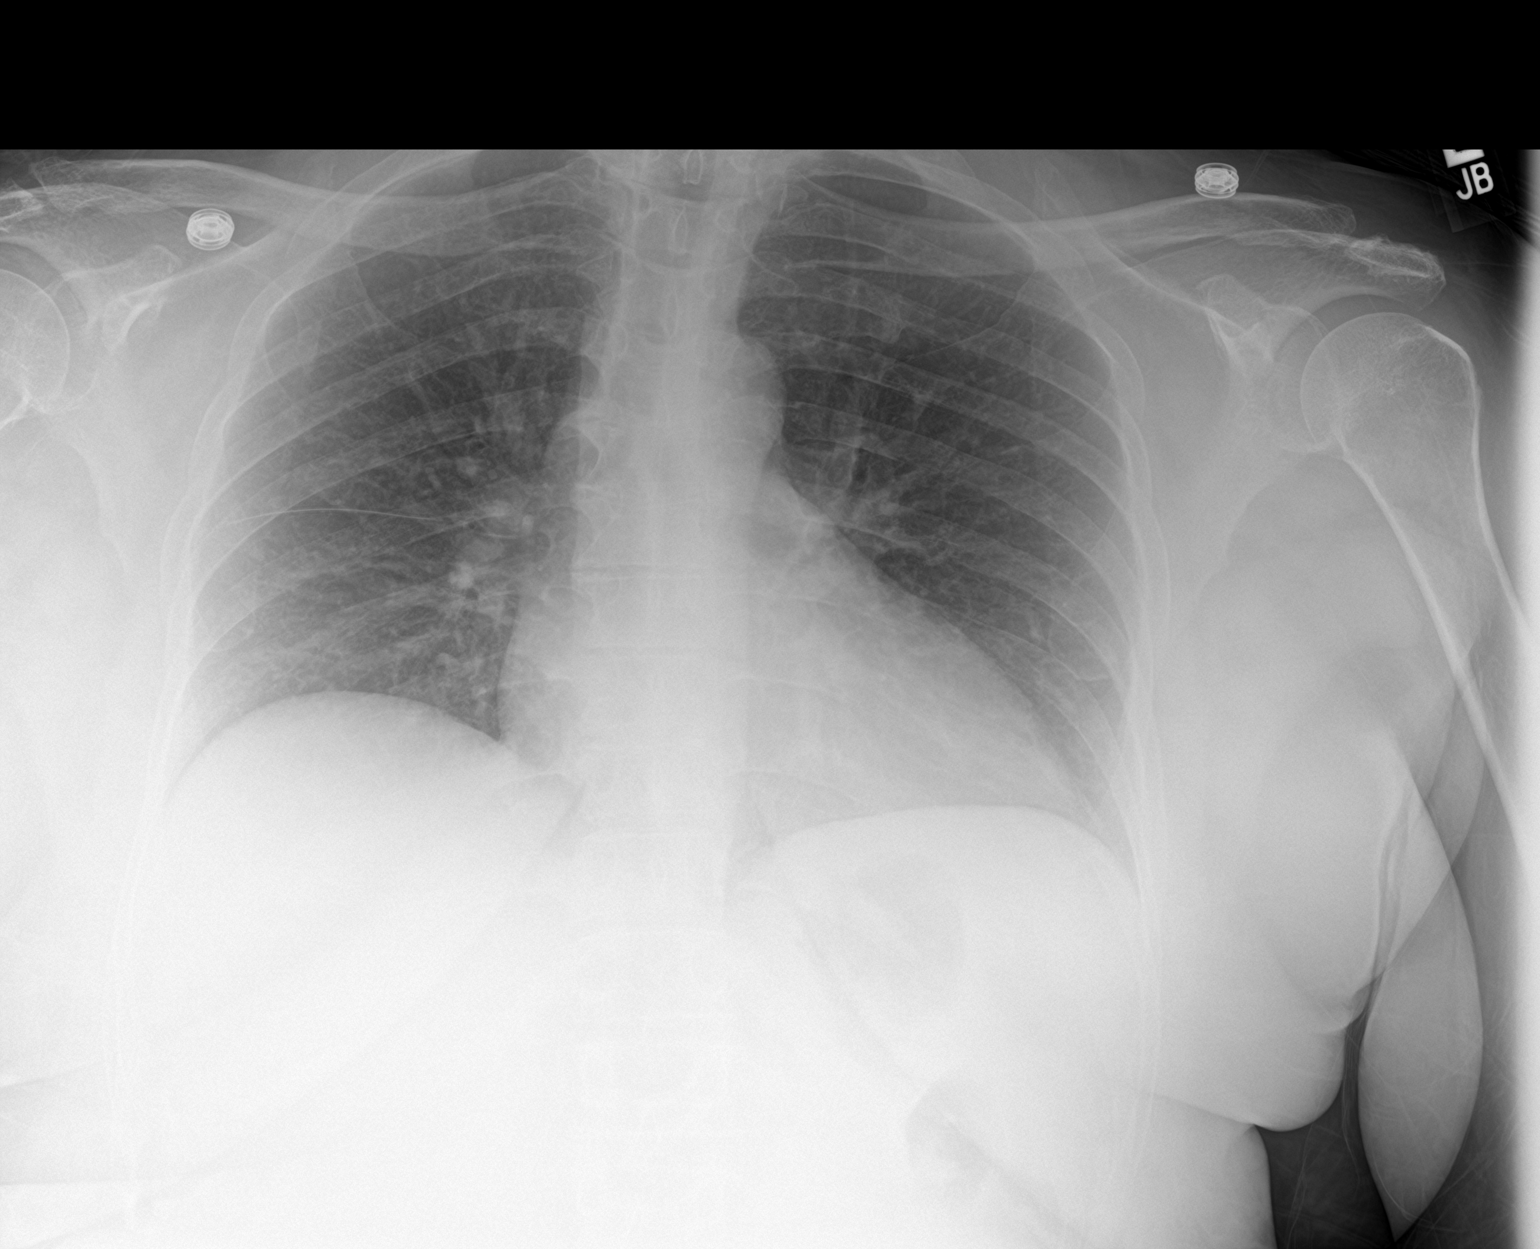

[2 of 2 positions shown; findings below may reference images not displayed]

FINDINGS: The heart size and mediastinal contours are within normal limits.
Both lungs are clear. The visualized skeletal structures are
unremarkable.
IMPRESSION: No active cardiopulmonary disease.
